# Patient Record
Sex: Female | Born: 1955
Health system: Southern US, Community
[De-identification: ages and names within clinical notes are randomized; demographics above are authoritative.]

## PROBLEM LIST (undated history)

## (undated) DIAGNOSIS — E109 Type 1 diabetes mellitus without complications: Secondary | ICD-10-CM

## (undated) DIAGNOSIS — T7840XA Allergy, unspecified, initial encounter: Secondary | ICD-10-CM

## (undated) DIAGNOSIS — B009 Herpesviral infection, unspecified: Secondary | ICD-10-CM

## (undated) DIAGNOSIS — M8580 Other specified disorders of bone density and structure, unspecified site: Secondary | ICD-10-CM

## (undated) DIAGNOSIS — E559 Vitamin D deficiency, unspecified: Secondary | ICD-10-CM

## (undated) DIAGNOSIS — D649 Anemia, unspecified: Secondary | ICD-10-CM

## (undated) HISTORY — DX: Type 1 diabetes mellitus without complications: E10.9

## (undated) HISTORY — PX: APPENDECTOMY: SHX54

## (undated) HISTORY — DX: Other specified disorders of bone density and structure, unspecified site: M85.80

## (undated) HISTORY — PX: OTHER SURGICAL HISTORY: SHX169

## (undated) HISTORY — PX: MULTIPLE TOOTH EXTRACTIONS: SHX2053

## (undated) HISTORY — DX: Allergy, unspecified, initial encounter: T78.40XA

## (undated) HISTORY — DX: Anemia, unspecified: D64.9

## (undated) HISTORY — DX: Herpesviral infection, unspecified: B00.9

## (undated) HISTORY — DX: Vitamin D deficiency, unspecified: E55.9

---

## 1998-10-07 ENCOUNTER — Other Ambulatory Visit: Admission: RE | Admit: 1998-10-07 | Discharge: 1998-10-07 | Payer: Self-pay | Admitting: Gynecology

## 2000-07-11 ENCOUNTER — Other Ambulatory Visit: Admission: RE | Admit: 2000-07-11 | Discharge: 2000-07-11 | Payer: Self-pay | Admitting: Obstetrics and Gynecology

## 2001-12-21 ENCOUNTER — Encounter: Admission: RE | Admit: 2001-12-21 | Discharge: 2002-03-21 | Payer: Self-pay | Admitting: Internal Medicine

## 2002-04-05 ENCOUNTER — Encounter: Admission: RE | Admit: 2002-04-05 | Discharge: 2002-04-11 | Payer: Self-pay | Admitting: Internal Medicine

## 2002-05-07 ENCOUNTER — Encounter: Admission: RE | Admit: 2002-05-07 | Discharge: 2002-08-05 | Payer: Self-pay | Admitting: Internal Medicine

## 2004-07-09 ENCOUNTER — Encounter: Admission: RE | Admit: 2004-07-09 | Discharge: 2004-07-09 | Payer: Self-pay | Admitting: Obstetrics and Gynecology

## 2004-09-03 ENCOUNTER — Ambulatory Visit: Payer: Self-pay | Admitting: Internal Medicine

## 2004-09-08 ENCOUNTER — Ambulatory Visit: Payer: Self-pay | Admitting: Internal Medicine

## 2004-09-08 HISTORY — PX: COLONOSCOPY: SHX174

## 2005-10-26 ENCOUNTER — Ambulatory Visit (HOSPITAL_COMMUNITY): Admission: RE | Admit: 2005-10-26 | Discharge: 2005-10-26 | Payer: Self-pay | Admitting: Chiropractic Medicine

## 2009-12-03 ENCOUNTER — Encounter: Admission: RE | Admit: 2009-12-03 | Discharge: 2009-12-03 | Payer: Self-pay | Admitting: Internal Medicine

## 2011-05-12 IMAGING — US US ABDOMEN COMPLETE
1 series · 13 of 25 positions shown · non-contrast
Comparison: None

CLINICAL DATA: Right upper quadrant abdominal pain.  History of
appendectomy.

ABDOMINAL ULTRASOUND COMPLETE

[Series 1: us abdomen complete · 0.24mm/px · 13 of 82 slices shown]
[im 1/82]
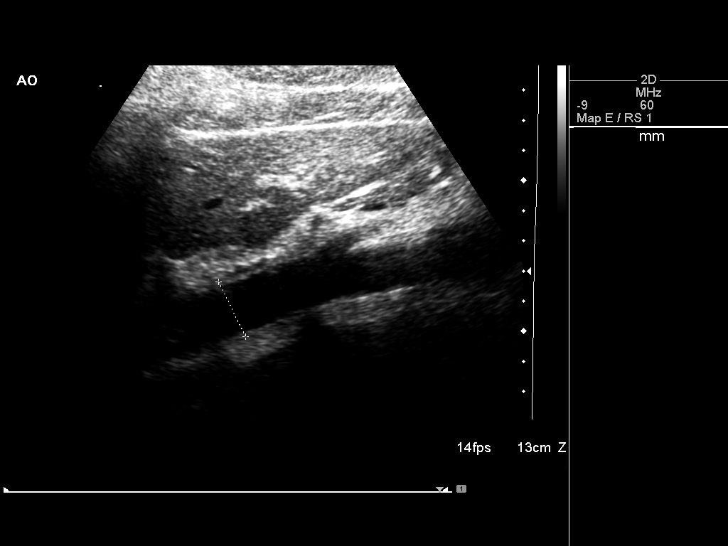
[im 7/82]
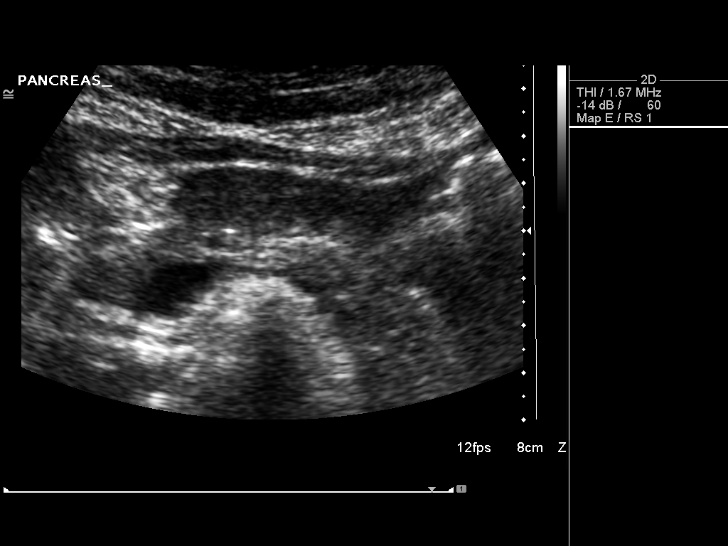
[im 14/82]
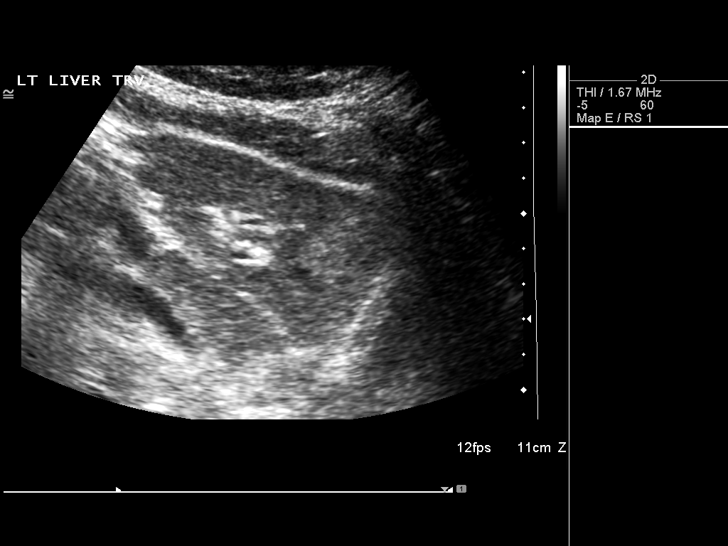
[im 21/82]
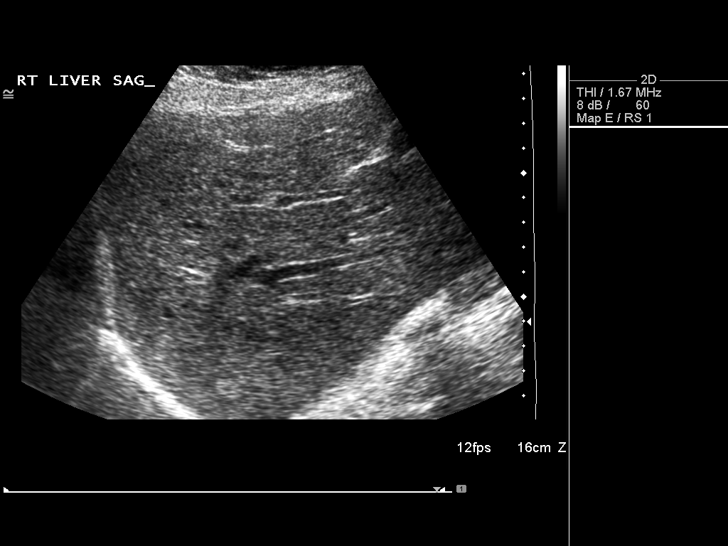
[im 28/82]
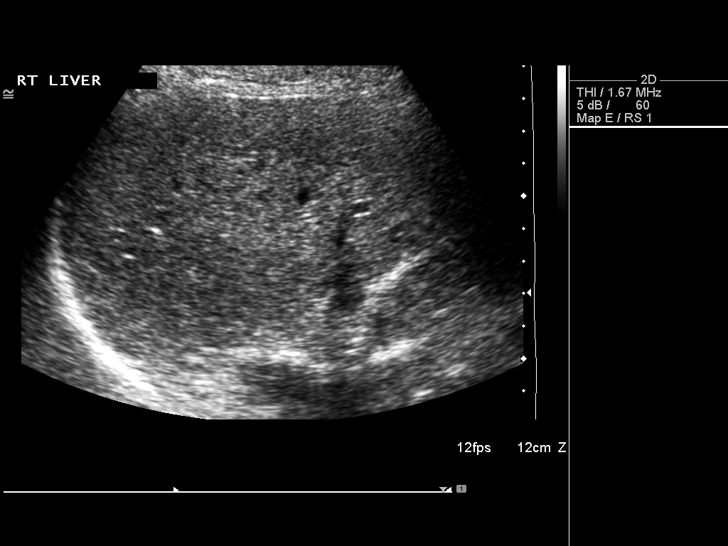
[im 34/82]
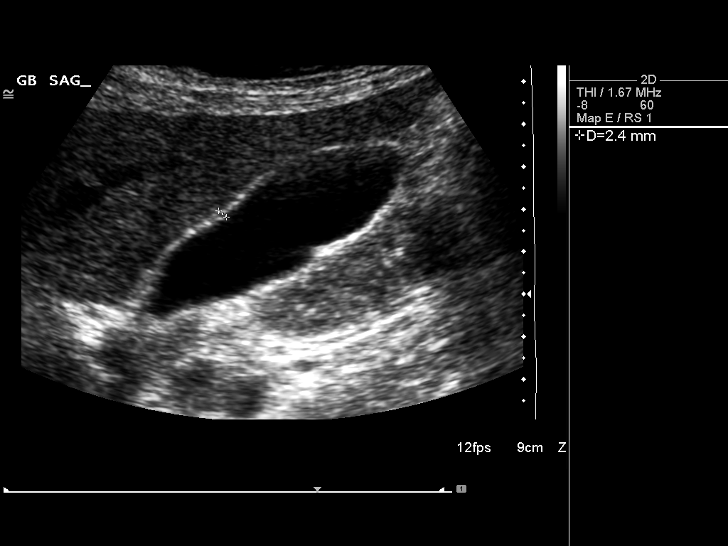
[im 41/82]
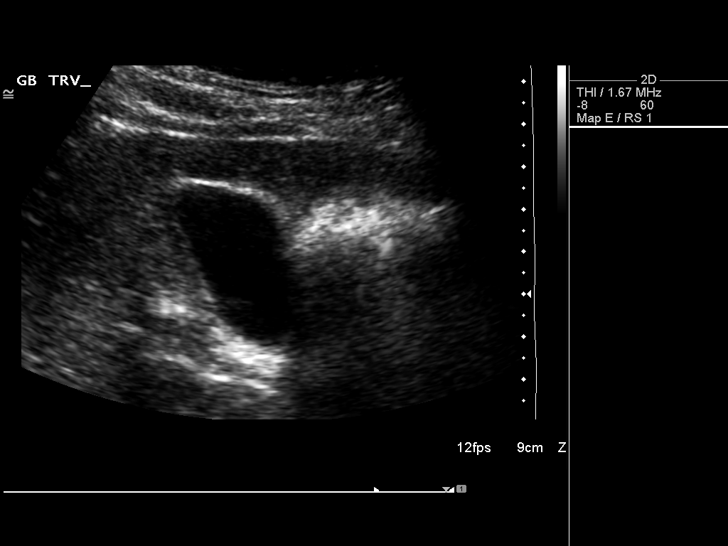
[im 48/82]
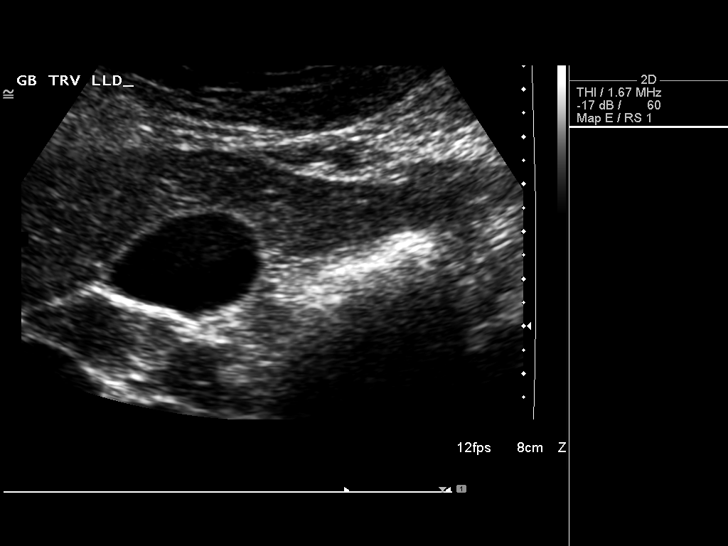
[im 55/82]
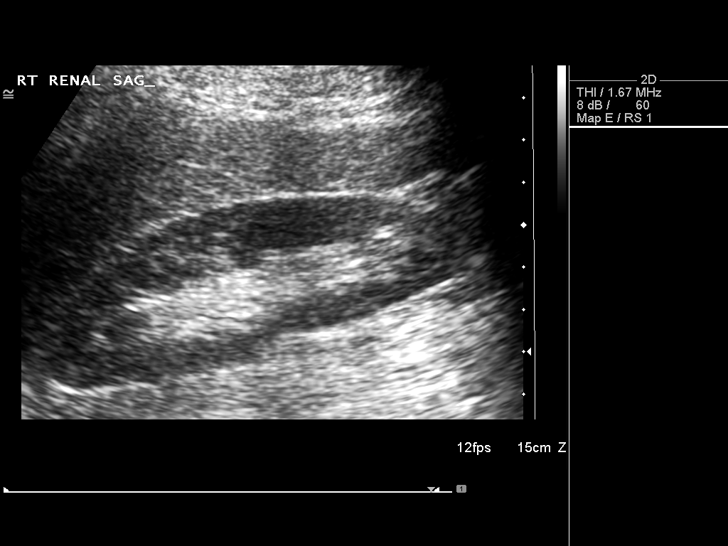
[im 61/82]
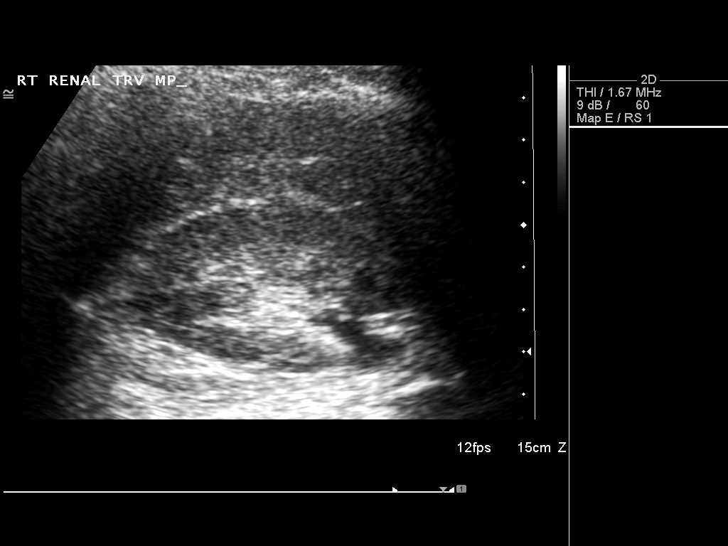
[im 68/82]
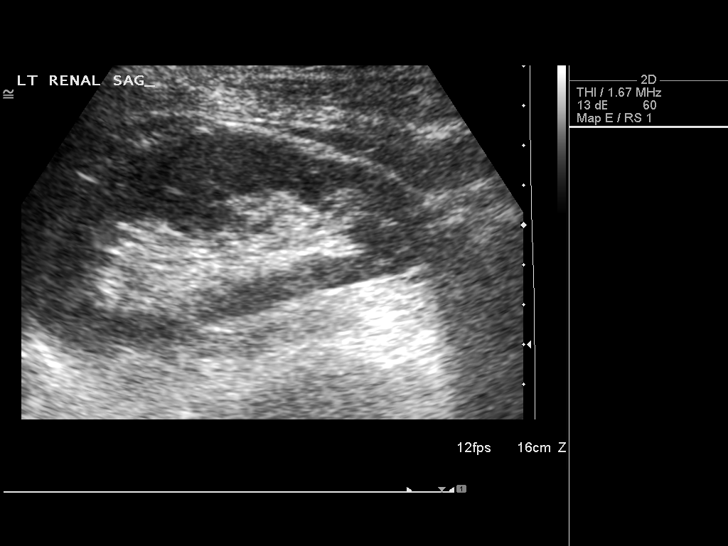
[im 75/82]
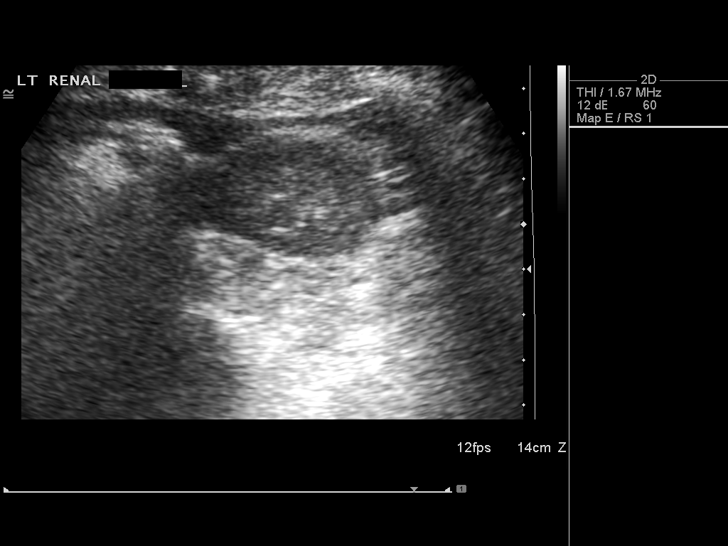
[im 82/82]
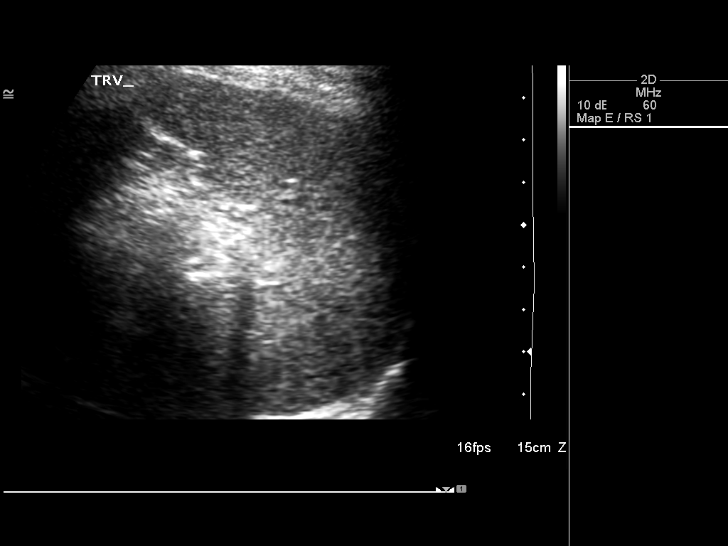

[13 of 25 positions shown; findings below may reference images not displayed]

FINDINGS: Gallbladder: No shadowing gallstones or echogenic sludge. No
gallbladder wall thickening or pericholecystic fluid. The
gallbladder wall thickness measured 2.4 mm. No sonographic Murphy's
sign according to the ultrasound technologist.

CBD: Normal in caliber measuring 3.2 mm. No choledocholithiasis is
evident.

Liver:  Normal size and echotexture without focal parenchymal
abnormality.

IVC:  Patent throughout its visualized course in the abdomen.

Pancreas:  Although the pancreas is difficult to visualize in its
entirety, no focal pancreatic abnormality is identified.

Spleen:  Normal size and echotexture without focal abnormality.
Length is 7.5 cm.

Right kidney:  No hydronephrosis.  Well-preserved cortex.  Normal
parenchymal echotexture without focal abnormalities.  Right renal
length is 10.5 cm.

Left kidney:  No hydronephrosis.  Well-preserved cortex.  Normal
parenchymal echotexture without focal abnormalities.  Left renal
length is 10.2 cm.

Aorta:  Maximum diameter is 2.0 cm.  No aneurysm is evident.

Ascites:  None.
IMPRESSION: No abdominal pathology was demonstrated.

## 2016-01-22 ENCOUNTER — Other Ambulatory Visit: Payer: Self-pay | Admitting: Internal Medicine

## 2016-01-22 DIAGNOSIS — Z1231 Encounter for screening mammogram for malignant neoplasm of breast: Secondary | ICD-10-CM

## 2016-02-23 ENCOUNTER — Ambulatory Visit
Admission: RE | Admit: 2016-02-23 | Discharge: 2016-02-23 | Disposition: A | Discharge status: Home / Self Care | Payer: BC Managed Care – PPO | Source: Ambulatory Visit | Attending: Internal Medicine | Admitting: Internal Medicine

## 2016-02-23 DIAGNOSIS — Z1231 Encounter for screening mammogram for malignant neoplasm of breast: Secondary | ICD-10-CM

## 2017-08-01 ENCOUNTER — Other Ambulatory Visit: Payer: Self-pay | Admitting: Internal Medicine

## 2017-08-01 DIAGNOSIS — Z1231 Encounter for screening mammogram for malignant neoplasm of breast: Secondary | ICD-10-CM

## 2017-08-26 ENCOUNTER — Ambulatory Visit: Payer: BC Managed Care – PPO

## 2018-09-21 ENCOUNTER — Other Ambulatory Visit: Payer: Self-pay | Admitting: Internal Medicine

## 2018-09-21 DIAGNOSIS — Z1231 Encounter for screening mammogram for malignant neoplasm of breast: Secondary | ICD-10-CM

## 2019-10-26 ENCOUNTER — Encounter: Payer: Self-pay | Admitting: Gastroenterology

## 2019-11-01 ENCOUNTER — Other Ambulatory Visit: Payer: Self-pay | Admitting: Internal Medicine

## 2019-11-01 DIAGNOSIS — Z1231 Encounter for screening mammogram for malignant neoplasm of breast: Secondary | ICD-10-CM

## 2019-11-14 ENCOUNTER — Telehealth: Payer: Self-pay | Admitting: *Deleted

## 2019-11-14 ENCOUNTER — Telehealth: Payer: Self-pay | Admitting: General Surgery

## 2019-11-14 NOTE — Telephone Encounter (Signed)
This pt has an insulin pump- she is scheduled for a PV 5-20 - she has a colon 6-3 Thursday with Dr Tarri Glenn   Can you please send a letter to her MD for instructions for her pump   Jodi Knight PV

## 2019-11-14 NOTE — Telephone Encounter (Signed)
Spoke with Lelan Pons regarding patient, abstracted the data that was sent to [place] from Dr Loren Racer office visit 10/26/2019. Sent letter to his office regarding insulin pump

## 2019-11-14 NOTE — Telephone Encounter (Signed)
I am not certain who this patients endocrinologist or pcp is in order to send a note regarding her pump. There is no information in her chart at all. Should she not have had a visit with Dr Tarri Glenn first?

## 2019-11-14 NOTE — Telephone Encounter (Signed)
Sent letter via fax [phone] to Dr Osborne Casco regarding insulin pump instructions for the patients colonoscopy

## 2019-11-14 NOTE — Telephone Encounter (Signed)
No Ma'am, not just for an insulin pump- we received records from Dr Domenick Gong- they are not required to have an OV for diabetes / pump- she is a recall pt- previously seen by Dr Olevia Perches

## 2019-11-15 ENCOUNTER — Ambulatory Visit (AMBULATORY_SURGERY_CENTER): Payer: Self-pay | Admitting: *Deleted

## 2019-11-15 ENCOUNTER — Other Ambulatory Visit: Payer: Self-pay

## 2019-11-15 VITALS — Ht 64.0 in | Wt 179.0 lb

## 2019-11-15 DIAGNOSIS — Z1211 Encounter for screening for malignant neoplasm of colon: Secondary | ICD-10-CM

## 2019-11-15 MED ORDER — SUTAB 1479-225-188 MG PO TABS: 1.0000 | ORAL_TABLET | ORAL | 0 refills | Status: DC

## 2019-11-15 NOTE — Progress Notes (Signed)
Patient denies any allergies to egg or soy products. Patient denies complications with anesthesia/sedation.  Patient denies oxygen use at home and denies diet medications. Emmi instructions for colonoscopy explained and given to patient. Sutab coupon given at [facility] appointment.

## 2019-11-16 NOTE — Telephone Encounter (Signed)
Return fax sent by Dr Osborne Casco regarding insulin pump for her procedure.  Reduce Basal to 50%, boluses as needed. She tests 8x/d. Resume settings post procedure.

## 2019-11-16 NOTE — Telephone Encounter (Signed)
Left a voicemail for the patient to contact our office regarding her insulin pump setting per Dr Osborne Casco

## 2019-11-19 NOTE — Telephone Encounter (Signed)
Patient contacted the office back regarding inusllin pump. Read the instructions per Dr Osborne Casco. The patient verbalized understanding

## 2019-11-22 ENCOUNTER — Ambulatory Visit
Admission: RE | Admit: 2019-11-22 | Discharge: 2019-11-22 | Disposition: A | Discharge status: Home / Self Care | Payer: BC Managed Care – PPO | Source: Ambulatory Visit | Attending: Internal Medicine | Admitting: Internal Medicine

## 2019-11-22 DIAGNOSIS — Z1231 Encounter for screening mammogram for malignant neoplasm of breast: Secondary | ICD-10-CM

## 2019-11-29 ENCOUNTER — Other Ambulatory Visit: Payer: Self-pay

## 2019-11-29 ENCOUNTER — Ambulatory Visit (AMBULATORY_SURGERY_CENTER): Payer: Medicare PPO | Admitting: Gastroenterology

## 2019-11-29 ENCOUNTER — Encounter: Payer: Self-pay | Admitting: Gastroenterology

## 2019-11-29 VITALS — BP 106/51 | HR 50 | Temp 96.8°F | Resp 20 | Ht 64.0 in | Wt 179.0 lb

## 2019-11-29 DIAGNOSIS — D128 Benign neoplasm of rectum: Secondary | ICD-10-CM | POA: Diagnosis not present

## 2019-11-29 DIAGNOSIS — Z1211 Encounter for screening for malignant neoplasm of colon: Secondary | ICD-10-CM

## 2019-11-29 DIAGNOSIS — D122 Benign neoplasm of ascending colon: Secondary | ICD-10-CM

## 2019-11-29 DIAGNOSIS — D127 Benign neoplasm of rectosigmoid junction: Secondary | ICD-10-CM

## 2019-11-29 DIAGNOSIS — D125 Benign neoplasm of sigmoid colon: Secondary | ICD-10-CM

## 2019-11-29 MED ORDER — SODIUM CHLORIDE 0.9 % IV SOLN
500.0000 mL | Freq: Once | INTRAVENOUS | Status: DC
Start: 2019-11-29 — End: 2019-11-29

## 2019-11-29 NOTE — Op Note (Signed)
[facility] Patient Name: Jodi Knight Procedure Date: 11/29/2019 8:51 AM MRN: [id number] Endoscopist: Thornton Park MD, MD Age: 64 Referring MD:  Date of Birth: January 05, 1956 Gender: Female Account #: [id number] Procedure:                Colonoscopy Indications:              Screening for colorectal malignant neoplasm                           Normal screening colonoscopy with Dr. Olevia Perches in 2006                           No known family history of colon cancer or polyps Medicines:                Monitored Anesthesia Care Procedure:                Pre-Anesthesia Assessment:                           - Prior to the procedure, a History and Physical                            was performed, and patient medications and                            allergies were reviewed. The patient's tolerance of                            previous anesthesia was also reviewed. The risks                            and benefits of the procedure and the sedation                            options and risks were discussed with the patient.                            All questions were answered, and informed consent                            was obtained. Prior Anticoagulants: The patient has                            taken no previous anticoagulant or antiplatelet                            agents. ASA Grade Assessment: II - A patient with                            mild systemic disease. After reviewing the risks                            and benefits, the patient was deemed in  satisfactory condition to undergo the procedure.                           After obtaining informed consent, the colonoscope                            was passed under direct vision. Throughout the                            procedure, the patient's blood pressure, pulse, and                            oxygen saturations were monitored continuously. The                            Colonoscope  was introduced through the anus and                            advanced to the 3 cm into the ileum. A second                            forward view of the right colon was performed. The                            colonoscopy was performed without difficulty. The                            patient tolerated the procedure well. The quality                            of the bowel preparation was good. The terminal                            ileum, ileocecal valve, appendiceal orifice, and                            rectum were photographed. Scope In: 9:01:29 AM Scope Out: 9:15:02 AM Scope Withdrawal Time: 0 hours 9 minutes 18 seconds  Total Procedure Duration: 0 hours 13 minutes 33 seconds  Findings:                 The perianal and digital rectal examinations were                            normal.                           Three sessile polyps were found in the rectum,                            sigmoid colon and ascending colon. The polyps were                            1 to 2 mm in size. These polyps were removed with a  cold snare. Resection and retrieval were complete.                            Estimated blood loss was minimal.                           The exam was otherwise without abnormality on                            direct and retroflexion views. Complications:            No immediate complications. Estimated blood loss:                            Minimal. Estimated Blood Loss:     Estimated blood loss was minimal. Impression:               - Three 1 to 2 mm polyps in the rectum, in the                            sigmoid colon and in the ascending colon, removed                            with a cold snare. Resected and retrieved.                           - The examination was otherwise normal on direct                            and retroflexion views. Recommendation:           - Patient has a contact number available for                             emergencies. The signs and symptoms of potential                            delayed complications were discussed with the                            patient. Return to normal activities tomorrow.                            Written discharge instructions were provided to the                            patient.                           - Resume previous diet.                           - Continue present medications.                           - Await pathology results.                           -  Repeat colonoscopy date to be determined after                            pending pathology results are reviewed for                            surveillance.                           - Emerging evidence supports eating a diet of                            fruits, vegetables, grains, calcium, and yogurt                            while reducing red meat and alcohol may reduce the                            risk of colon cancer.                           - Thank you for allowing me to be involved in your                            colon cancer prevention. Thornton Park MD, MD 11/29/2019 9:22:10 AM This report has been signed electronically.

## 2019-11-29 NOTE — Progress Notes (Signed)
Temp JB VS DT  Pt's states no medical or surgical changes since previsit or office visit. 

## 2019-11-29 NOTE — Progress Notes (Signed)
Called to room to assist during endoscopic procedure.  Patient ID and intended procedure confirmed with present staff. Received instructions for my participation in the procedure from the performing physician.  

## 2019-11-29 NOTE — Patient Instructions (Signed)
Discharge instructions given. Handouts on polyps. Resume previous medications. YOU HAD AN ENDOSCOPIC PROCEDURE TODAY AT THE [facility]:   Refer to the procedure report that was given to you for any specific questions about what was found during the examination.  If the procedure report does not answer your questions, please call your gastroenterologist to clarify.  If you requested that your care partner not be given the details of your procedure findings, then the procedure report has been included in a sealed envelope for you to review at your convenience later.  YOU SHOULD EXPECT: Some feelings of bloating in the abdomen. Passage of more gas than usual.  Walking can help get rid of the air that was put into your GI tract during the procedure and reduce the bloating. If you had a lower endoscopy (such as a colonoscopy or flexible sigmoidoscopy) you may notice spotting of blood in your stool or on the toilet paper. If you underwent a bowel prep for your procedure, you may not have a normal bowel movement for a few days.  Please Note:  You might notice some irritation and congestion in your nose or some drainage.  This is from the oxygen used during your procedure.  There is no need for concern and it should clear up in a day or so.  SYMPTOMS TO REPORT IMMEDIATELY:  Following lower endoscopy (colonoscopy or flexible sigmoidoscopy):  Excessive amounts of blood in the stool  Significant tenderness or worsening of abdominal pains  Swelling of the abdomen that is new, acute  Fever of 100F or higher   For urgent or emergent issues, a gastroenterologist can be reached at any hour by calling [phone]. Do not use MyChart messaging for urgent concerns.    DIET:  We do recommend a small meal at first, but then you may proceed to your regular diet.  Drink plenty of fluids but you should avoid alcoholic beverages for 24 hours.  ACTIVITY:  You should plan to take it easy for the rest  of today and you should NOT DRIVE or use heavy machinery until tomorrow (because of the sedation medicines used during the test).    FOLLOW UP: Our staff will call the number listed on your records 48-72 hours following your procedure to check on you and address any questions or concerns that you may have regarding the information given to you following your procedure. If we do not reach you, we will leave a message.  We will attempt to reach you two times.  During this call, we will ask if you have developed any symptoms of COVID 19. If you develop any symptoms (ie: fever, flu-like symptoms, shortness of breath, cough etc.) before then, please call [phone].  If you test positive for Covid 19 in the 2 weeks post procedure, please call and report this information to us.    If any biopsies were taken you will be contacted by phone or by letter within the next 1-3 weeks.  Please call us at [phone] if you have not heard about the biopsies in 3 weeks.    SIGNATURES/CONFIDENTIALITY: You and/or your care partner have signed paperwork which will be entered into your electronic medical record.  These signatures attest to the fact that that the information above on your After Visit Summary has been reviewed and is understood.  Full responsibility of the confidentiality of this discharge information lies with you and/or your care-partner.  

## 2019-11-29 NOTE — Progress Notes (Signed)
Pt Drowsy. VSS. To PACU, report to RN. No anesthetic complications noted.  

## 2019-12-03 ENCOUNTER — Telehealth: Payer: Self-pay | Admitting: *Deleted

## 2019-12-03 NOTE — Telephone Encounter (Signed)
  Follow up Call-  Call back number 11/29/2019  Post procedure Call Back phone  # [phone]  Permission to leave phone message Yes  Some recent data might be hidden     Patient questions:  Do you have a fever, pain , or abdominal swelling? No. Pain Score  0 *  Have you tolerated food without any problems? Yes.    Have you been able to return to your normal activities? Yes.    Do you have any questions about your discharge instructions: Diet   No. Medications  No. Follow up visit  No.  Do you have questions or concerns about your Care? No.  Actions: * If pain score is 4 or above: 1. No action needed, pain <4.Have you developed a fever since your procedure? no  2.   Have you had an respiratory symptoms (SOB or cough) since your procedure? no  3.   Have you tested positive for COVID 19 since your procedure no  4.   Have you had any family members/close contacts diagnosed with the COVID 19 since your procedure?  no   If yes to any of these questions please route to Joylene John, RN and Erenest Rasher, RN

## 2019-12-06 ENCOUNTER — Encounter: Payer: Self-pay | Admitting: Gastroenterology

## 2020-01-21 DIAGNOSIS — E109 Type 1 diabetes mellitus without complications: Secondary | ICD-10-CM | POA: Diagnosis not present

## 2020-02-06 DIAGNOSIS — Z794 Long term (current) use of insulin: Secondary | ICD-10-CM | POA: Diagnosis not present

## 2020-02-06 DIAGNOSIS — M8580 Other specified disorders of bone density and structure, unspecified site: Secondary | ICD-10-CM | POA: Diagnosis not present

## 2020-02-06 DIAGNOSIS — E559 Vitamin D deficiency, unspecified: Secondary | ICD-10-CM | POA: Diagnosis not present

## 2020-02-06 DIAGNOSIS — Z8616 Personal history of COVID-19: Secondary | ICD-10-CM | POA: Diagnosis not present

## 2020-02-06 DIAGNOSIS — E109 Type 1 diabetes mellitus without complications: Secondary | ICD-10-CM | POA: Diagnosis not present

## 2020-02-06 DIAGNOSIS — Z20822 Contact with and (suspected) exposure to covid-19: Secondary | ICD-10-CM | POA: Diagnosis not present

## 2020-02-20 ENCOUNTER — Other Ambulatory Visit: Payer: Self-pay

## 2020-02-20 DIAGNOSIS — Z20822 Contact with and (suspected) exposure to covid-19: Secondary | ICD-10-CM | POA: Diagnosis not present

## 2020-02-21 LAB — NOVEL CORONAVIRUS, NAA: SARS-CoV-2, NAA: NOT DETECTED

## 2020-02-21 LAB — SARS-COV-2, NAA 2 DAY TAT

## 2020-03-04 DIAGNOSIS — E109 Type 1 diabetes mellitus without complications: Secondary | ICD-10-CM | POA: Diagnosis not present

## 2020-03-10 DIAGNOSIS — Z20822 Contact with and (suspected) exposure to covid-19: Secondary | ICD-10-CM | POA: Diagnosis not present

## 2020-04-10 DIAGNOSIS — E109 Type 1 diabetes mellitus without complications: Secondary | ICD-10-CM | POA: Diagnosis not present

## 2020-05-09 DIAGNOSIS — E119 Type 2 diabetes mellitus without complications: Secondary | ICD-10-CM | POA: Diagnosis not present

## 2020-05-09 DIAGNOSIS — H5213 Myopia, bilateral: Secondary | ICD-10-CM | POA: Diagnosis not present

## 2020-05-09 DIAGNOSIS — H2513 Age-related nuclear cataract, bilateral: Secondary | ICD-10-CM | POA: Diagnosis not present

## 2020-05-28 DIAGNOSIS — M25569 Pain in unspecified knee: Secondary | ICD-10-CM | POA: Diagnosis not present

## 2020-05-28 DIAGNOSIS — E109 Type 1 diabetes mellitus without complications: Secondary | ICD-10-CM | POA: Diagnosis not present

## 2020-05-28 DIAGNOSIS — Z8616 Personal history of COVID-19: Secondary | ICD-10-CM | POA: Diagnosis not present

## 2020-05-28 DIAGNOSIS — E669 Obesity, unspecified: Secondary | ICD-10-CM | POA: Diagnosis not present

## 2020-05-28 DIAGNOSIS — E559 Vitamin D deficiency, unspecified: Secondary | ICD-10-CM | POA: Diagnosis not present

## 2020-05-28 DIAGNOSIS — Z794 Long term (current) use of insulin: Secondary | ICD-10-CM | POA: Diagnosis not present

## 2020-05-28 DIAGNOSIS — Z23 Encounter for immunization: Secondary | ICD-10-CM | POA: Diagnosis not present

## 2020-05-28 DIAGNOSIS — M8580 Other specified disorders of bone density and structure, unspecified site: Secondary | ICD-10-CM | POA: Diagnosis not present

## 2020-05-28 DIAGNOSIS — Z6830 Body mass index (BMI) 30.0-30.9, adult: Secondary | ICD-10-CM | POA: Diagnosis not present

## 2020-06-03 DIAGNOSIS — E109 Type 1 diabetes mellitus without complications: Secondary | ICD-10-CM | POA: Diagnosis not present

## 2020-06-23 DIAGNOSIS — Z20822 Contact with and (suspected) exposure to covid-19: Secondary | ICD-10-CM | POA: Diagnosis not present

## 2020-07-10 DIAGNOSIS — M8589 Other specified disorders of bone density and structure, multiple sites: Secondary | ICD-10-CM | POA: Diagnosis not present

## 2020-07-10 DIAGNOSIS — E559 Vitamin D deficiency, unspecified: Secondary | ICD-10-CM | POA: Diagnosis not present

## 2020-07-29 DIAGNOSIS — Z4681 Encounter for fitting and adjustment of insulin pump: Secondary | ICD-10-CM | POA: Diagnosis not present

## 2020-07-29 DIAGNOSIS — E109 Type 1 diabetes mellitus without complications: Secondary | ICD-10-CM | POA: Diagnosis not present

## 2020-07-29 DIAGNOSIS — Z794 Long term (current) use of insulin: Secondary | ICD-10-CM | POA: Diagnosis not present

## 2020-08-21 DIAGNOSIS — E109 Type 1 diabetes mellitus without complications: Secondary | ICD-10-CM | POA: Diagnosis not present

## 2020-09-08 DIAGNOSIS — E109 Type 1 diabetes mellitus without complications: Secondary | ICD-10-CM | POA: Diagnosis not present

## 2020-09-08 DIAGNOSIS — Z794 Long term (current) use of insulin: Secondary | ICD-10-CM | POA: Diagnosis not present

## 2020-09-08 DIAGNOSIS — Z4681 Encounter for fitting and adjustment of insulin pump: Secondary | ICD-10-CM | POA: Diagnosis not present

## 2020-09-18 DIAGNOSIS — E109 Type 1 diabetes mellitus without complications: Secondary | ICD-10-CM | POA: Diagnosis not present

## 2020-10-08 DIAGNOSIS — E669 Obesity, unspecified: Secondary | ICD-10-CM | POA: Diagnosis not present

## 2020-10-08 DIAGNOSIS — E109 Type 1 diabetes mellitus without complications: Secondary | ICD-10-CM | POA: Diagnosis not present

## 2020-10-08 DIAGNOSIS — M8580 Other specified disorders of bone density and structure, unspecified site: Secondary | ICD-10-CM | POA: Diagnosis not present

## 2020-10-08 DIAGNOSIS — M25569 Pain in unspecified knee: Secondary | ICD-10-CM | POA: Diagnosis not present

## 2020-10-08 DIAGNOSIS — E559 Vitamin D deficiency, unspecified: Secondary | ICD-10-CM | POA: Diagnosis not present

## 2020-10-08 DIAGNOSIS — Z6830 Body mass index (BMI) 30.0-30.9, adult: Secondary | ICD-10-CM | POA: Diagnosis not present

## 2020-10-08 DIAGNOSIS — Z794 Long term (current) use of insulin: Secondary | ICD-10-CM | POA: Diagnosis not present

## 2020-10-19 DIAGNOSIS — E109 Type 1 diabetes mellitus without complications: Secondary | ICD-10-CM | POA: Diagnosis not present

## 2020-11-13 DIAGNOSIS — E109 Type 1 diabetes mellitus without complications: Secondary | ICD-10-CM | POA: Diagnosis not present

## 2020-11-18 DIAGNOSIS — E109 Type 1 diabetes mellitus without complications: Secondary | ICD-10-CM | POA: Diagnosis not present

## 2020-11-26 DIAGNOSIS — E109 Type 1 diabetes mellitus without complications: Secondary | ICD-10-CM | POA: Diagnosis not present

## 2020-11-26 DIAGNOSIS — Z4681 Encounter for fitting and adjustment of insulin pump: Secondary | ICD-10-CM | POA: Diagnosis not present

## 2020-11-26 DIAGNOSIS — Z794 Long term (current) use of insulin: Secondary | ICD-10-CM | POA: Diagnosis not present

## 2020-12-19 DIAGNOSIS — E109 Type 1 diabetes mellitus without complications: Secondary | ICD-10-CM | POA: Diagnosis not present

## 2021-01-18 DIAGNOSIS — E109 Type 1 diabetes mellitus without complications: Secondary | ICD-10-CM | POA: Diagnosis not present

## 2021-02-02 DIAGNOSIS — E109 Type 1 diabetes mellitus without complications: Secondary | ICD-10-CM | POA: Diagnosis not present

## 2021-02-18 DIAGNOSIS — E109 Type 1 diabetes mellitus without complications: Secondary | ICD-10-CM | POA: Diagnosis not present

## 2021-03-21 DIAGNOSIS — E109 Type 1 diabetes mellitus without complications: Secondary | ICD-10-CM | POA: Diagnosis not present

## 2021-04-02 DIAGNOSIS — Z4681 Encounter for fitting and adjustment of insulin pump: Secondary | ICD-10-CM | POA: Diagnosis not present

## 2021-04-02 DIAGNOSIS — Z23 Encounter for immunization: Secondary | ICD-10-CM | POA: Diagnosis not present

## 2021-04-02 DIAGNOSIS — E109 Type 1 diabetes mellitus without complications: Secondary | ICD-10-CM | POA: Diagnosis not present

## 2021-04-02 DIAGNOSIS — Z794 Long term (current) use of insulin: Secondary | ICD-10-CM | POA: Diagnosis not present

## 2021-04-20 DIAGNOSIS — E109 Type 1 diabetes mellitus without complications: Secondary | ICD-10-CM | POA: Diagnosis not present

## 2021-04-23 DIAGNOSIS — E109 Type 1 diabetes mellitus without complications: Secondary | ICD-10-CM | POA: Diagnosis not present

## 2021-05-12 DIAGNOSIS — H2513 Age-related nuclear cataract, bilateral: Secondary | ICD-10-CM | POA: Diagnosis not present

## 2021-05-12 DIAGNOSIS — H5213 Myopia, bilateral: Secondary | ICD-10-CM | POA: Diagnosis not present

## 2021-05-12 DIAGNOSIS — E113293 Type 2 diabetes mellitus with mild nonproliferative diabetic retinopathy without macular edema, bilateral: Secondary | ICD-10-CM | POA: Diagnosis not present

## 2021-05-27 DIAGNOSIS — Z794 Long term (current) use of insulin: Secondary | ICD-10-CM | POA: Diagnosis not present

## 2021-05-27 DIAGNOSIS — Z4681 Encounter for fitting and adjustment of insulin pump: Secondary | ICD-10-CM | POA: Diagnosis not present

## 2021-05-27 DIAGNOSIS — M8580 Other specified disorders of bone density and structure, unspecified site: Secondary | ICD-10-CM | POA: Diagnosis not present

## 2021-05-27 DIAGNOSIS — Z8616 Personal history of COVID-19: Secondary | ICD-10-CM | POA: Diagnosis not present

## 2021-05-27 DIAGNOSIS — E669 Obesity, unspecified: Secondary | ICD-10-CM | POA: Diagnosis not present

## 2021-05-27 DIAGNOSIS — E559 Vitamin D deficiency, unspecified: Secondary | ICD-10-CM | POA: Diagnosis not present

## 2021-05-27 DIAGNOSIS — E109 Type 1 diabetes mellitus without complications: Secondary | ICD-10-CM | POA: Diagnosis not present

## 2021-07-02 DIAGNOSIS — E109 Type 1 diabetes mellitus without complications: Secondary | ICD-10-CM | POA: Diagnosis not present

## 2021-07-02 DIAGNOSIS — Z4681 Encounter for fitting and adjustment of insulin pump: Secondary | ICD-10-CM | POA: Diagnosis not present

## 2021-07-02 DIAGNOSIS — Z794 Long term (current) use of insulin: Secondary | ICD-10-CM | POA: Diagnosis not present

## 2021-07-13 DIAGNOSIS — E109 Type 1 diabetes mellitus without complications: Secondary | ICD-10-CM | POA: Diagnosis not present

## 2021-09-30 DIAGNOSIS — E109 Type 1 diabetes mellitus without complications: Secondary | ICD-10-CM | POA: Diagnosis not present

## 2021-09-30 DIAGNOSIS — Z794 Long term (current) use of insulin: Secondary | ICD-10-CM | POA: Diagnosis not present

## 2021-09-30 DIAGNOSIS — Z4681 Encounter for fitting and adjustment of insulin pump: Secondary | ICD-10-CM | POA: Diagnosis not present

## 2021-10-05 DIAGNOSIS — E109 Type 1 diabetes mellitus without complications: Secondary | ICD-10-CM | POA: Diagnosis not present

## 2021-10-10 DIAGNOSIS — E109 Type 1 diabetes mellitus without complications: Secondary | ICD-10-CM | POA: Diagnosis not present

## 2021-11-10 DIAGNOSIS — H903 Sensorineural hearing loss, bilateral: Secondary | ICD-10-CM | POA: Diagnosis not present

## 2021-11-16 DIAGNOSIS — E109 Type 1 diabetes mellitus without complications: Secondary | ICD-10-CM | POA: Diagnosis not present

## 2021-11-16 DIAGNOSIS — E559 Vitamin D deficiency, unspecified: Secondary | ICD-10-CM | POA: Diagnosis not present

## 2021-11-16 DIAGNOSIS — M8580 Other specified disorders of bone density and structure, unspecified site: Secondary | ICD-10-CM | POA: Diagnosis not present

## 2021-11-16 DIAGNOSIS — Z1339 Encounter for screening examination for other mental health and behavioral disorders: Secondary | ICD-10-CM | POA: Diagnosis not present

## 2021-11-16 DIAGNOSIS — E669 Obesity, unspecified: Secondary | ICD-10-CM | POA: Diagnosis not present

## 2021-11-16 DIAGNOSIS — Z794 Long term (current) use of insulin: Secondary | ICD-10-CM | POA: Diagnosis not present

## 2021-11-16 DIAGNOSIS — Z1331 Encounter for screening for depression: Secondary | ICD-10-CM | POA: Diagnosis not present

## 2021-11-16 DIAGNOSIS — Z4681 Encounter for fitting and adjustment of insulin pump: Secondary | ICD-10-CM | POA: Diagnosis not present

## 2021-11-16 DIAGNOSIS — Z8616 Personal history of COVID-19: Secondary | ICD-10-CM | POA: Diagnosis not present

## 2022-01-01 DIAGNOSIS — E109 Type 1 diabetes mellitus without complications: Secondary | ICD-10-CM | POA: Diagnosis not present

## 2022-01-18 DIAGNOSIS — E109 Type 1 diabetes mellitus without complications: Secondary | ICD-10-CM | POA: Diagnosis not present

## 2022-02-09 DIAGNOSIS — Z794 Long term (current) use of insulin: Secondary | ICD-10-CM | POA: Diagnosis not present

## 2022-02-09 DIAGNOSIS — Z4681 Encounter for fitting and adjustment of insulin pump: Secondary | ICD-10-CM | POA: Diagnosis not present

## 2022-02-09 DIAGNOSIS — E109 Type 1 diabetes mellitus without complications: Secondary | ICD-10-CM | POA: Diagnosis not present

## 2022-03-22 DIAGNOSIS — E109 Type 1 diabetes mellitus without complications: Secondary | ICD-10-CM | POA: Diagnosis not present

## 2022-04-19 DIAGNOSIS — E109 Type 1 diabetes mellitus without complications: Secondary | ICD-10-CM | POA: Diagnosis not present

## 2022-05-10 DIAGNOSIS — H0261 Xanthelasma of right upper eyelid: Secondary | ICD-10-CM | POA: Diagnosis not present

## 2022-05-10 DIAGNOSIS — L820 Inflamed seborrheic keratosis: Secondary | ICD-10-CM | POA: Diagnosis not present

## 2022-05-10 DIAGNOSIS — L821 Other seborrheic keratosis: Secondary | ICD-10-CM | POA: Diagnosis not present

## 2022-05-10 DIAGNOSIS — H0264 Xanthelasma of left upper eyelid: Secondary | ICD-10-CM | POA: Diagnosis not present

## 2022-05-10 DIAGNOSIS — H0262 Xanthelasma of right lower eyelid: Secondary | ICD-10-CM | POA: Diagnosis not present

## 2022-05-10 DIAGNOSIS — H0265 Xanthelasma of left lower eyelid: Secondary | ICD-10-CM | POA: Diagnosis not present

## 2022-05-18 DIAGNOSIS — E113293 Type 2 diabetes mellitus with mild nonproliferative diabetic retinopathy without macular edema, bilateral: Secondary | ICD-10-CM | POA: Diagnosis not present

## 2022-05-18 DIAGNOSIS — H5213 Myopia, bilateral: Secondary | ICD-10-CM | POA: Diagnosis not present

## 2022-05-21 DIAGNOSIS — E109 Type 1 diabetes mellitus without complications: Secondary | ICD-10-CM | POA: Diagnosis not present

## 2022-05-21 DIAGNOSIS — E559 Vitamin D deficiency, unspecified: Secondary | ICD-10-CM | POA: Diagnosis not present

## 2022-05-21 DIAGNOSIS — Z1212 Encounter for screening for malignant neoplasm of rectum: Secondary | ICD-10-CM | POA: Diagnosis not present

## 2022-05-28 DIAGNOSIS — Z23 Encounter for immunization: Secondary | ICD-10-CM | POA: Diagnosis not present

## 2022-05-28 DIAGNOSIS — E103299 Type 1 diabetes mellitus with mild nonproliferative diabetic retinopathy without macular edema, unspecified eye: Secondary | ICD-10-CM | POA: Diagnosis not present

## 2022-05-28 DIAGNOSIS — Z4681 Encounter for fitting and adjustment of insulin pump: Secondary | ICD-10-CM | POA: Diagnosis not present

## 2022-05-28 DIAGNOSIS — R82998 Other abnormal findings in urine: Secondary | ICD-10-CM | POA: Diagnosis not present

## 2022-05-28 DIAGNOSIS — E559 Vitamin D deficiency, unspecified: Secondary | ICD-10-CM | POA: Diagnosis not present

## 2022-05-28 DIAGNOSIS — Z0000 Encounter for general adult medical examination without abnormal findings: Secondary | ICD-10-CM | POA: Diagnosis not present

## 2022-05-28 DIAGNOSIS — Z1331 Encounter for screening for depression: Secondary | ICD-10-CM | POA: Diagnosis not present

## 2022-05-28 DIAGNOSIS — Z794 Long term (current) use of insulin: Secondary | ICD-10-CM | POA: Diagnosis not present

## 2022-05-28 DIAGNOSIS — M8580 Other specified disorders of bone density and structure, unspecified site: Secondary | ICD-10-CM | POA: Diagnosis not present

## 2022-05-28 DIAGNOSIS — E669 Obesity, unspecified: Secondary | ICD-10-CM | POA: Diagnosis not present

## 2022-06-01 ENCOUNTER — Other Ambulatory Visit: Payer: Self-pay | Admitting: Internal Medicine

## 2022-06-01 DIAGNOSIS — Z1231 Encounter for screening mammogram for malignant neoplasm of breast: Secondary | ICD-10-CM

## 2022-06-10 DIAGNOSIS — E109 Type 1 diabetes mellitus without complications: Secondary | ICD-10-CM | POA: Diagnosis not present

## 2022-07-08 DIAGNOSIS — E109 Type 1 diabetes mellitus without complications: Secondary | ICD-10-CM | POA: Diagnosis not present

## 2022-07-29 ENCOUNTER — Ambulatory Visit
Admission: RE | Admit: 2022-07-29 | Discharge: 2022-07-29 | Disposition: A | Discharge status: Home / Self Care | Payer: Medicare PPO | Source: Ambulatory Visit | Attending: Internal Medicine | Admitting: Internal Medicine

## 2022-07-29 DIAGNOSIS — Z1231 Encounter for screening mammogram for malignant neoplasm of breast: Secondary | ICD-10-CM | POA: Diagnosis not present

## 2022-08-03 DIAGNOSIS — L089 Local infection of the skin and subcutaneous tissue, unspecified: Secondary | ICD-10-CM | POA: Diagnosis not present

## 2022-08-03 DIAGNOSIS — Z01419 Encounter for gynecological examination (general) (routine) without abnormal findings: Secondary | ICD-10-CM | POA: Diagnosis not present

## 2022-08-03 DIAGNOSIS — Z124 Encounter for screening for malignant neoplasm of cervix: Secondary | ICD-10-CM | POA: Diagnosis not present

## 2022-08-03 DIAGNOSIS — Z1151 Encounter for screening for human papillomavirus (HPV): Secondary | ICD-10-CM | POA: Diagnosis not present

## 2022-08-26 DIAGNOSIS — Z03818 Encounter for observation for suspected exposure to other biological agents ruled out: Secondary | ICD-10-CM | POA: Diagnosis not present

## 2022-08-26 DIAGNOSIS — J029 Acute pharyngitis, unspecified: Secondary | ICD-10-CM | POA: Diagnosis not present

## 2022-08-26 DIAGNOSIS — R0981 Nasal congestion: Secondary | ICD-10-CM | POA: Diagnosis not present

## 2022-08-26 DIAGNOSIS — R051 Acute cough: Secondary | ICD-10-CM | POA: Diagnosis not present

## 2022-08-26 DIAGNOSIS — J069 Acute upper respiratory infection, unspecified: Secondary | ICD-10-CM | POA: Diagnosis not present

## 2022-08-26 DIAGNOSIS — J3489 Other specified disorders of nose and nasal sinuses: Secondary | ICD-10-CM | POA: Diagnosis not present

## 2022-08-31 DIAGNOSIS — Z794 Long term (current) use of insulin: Secondary | ICD-10-CM | POA: Diagnosis not present

## 2022-08-31 DIAGNOSIS — E103299 Type 1 diabetes mellitus with mild nonproliferative diabetic retinopathy without macular edema, unspecified eye: Secondary | ICD-10-CM | POA: Diagnosis not present

## 2022-08-31 DIAGNOSIS — Z4681 Encounter for fitting and adjustment of insulin pump: Secondary | ICD-10-CM | POA: Diagnosis not present

## 2022-09-21 DIAGNOSIS — N611 Abscess of the breast and nipple: Secondary | ICD-10-CM | POA: Diagnosis not present

## 2022-09-30 DIAGNOSIS — E109 Type 1 diabetes mellitus without complications: Secondary | ICD-10-CM | POA: Diagnosis not present

## 2022-11-04 DIAGNOSIS — E109 Type 1 diabetes mellitus without complications: Secondary | ICD-10-CM | POA: Diagnosis not present

## 2022-11-15 DIAGNOSIS — E109 Type 1 diabetes mellitus without complications: Secondary | ICD-10-CM | POA: Diagnosis not present

## 2022-12-14 DIAGNOSIS — M8580 Other specified disorders of bone density and structure, unspecified site: Secondary | ICD-10-CM | POA: Diagnosis not present

## 2022-12-14 DIAGNOSIS — E103299 Type 1 diabetes mellitus with mild nonproliferative diabetic retinopathy without macular edema, unspecified eye: Secondary | ICD-10-CM | POA: Diagnosis not present

## 2022-12-14 DIAGNOSIS — E559 Vitamin D deficiency, unspecified: Secondary | ICD-10-CM | POA: Diagnosis not present

## 2022-12-14 DIAGNOSIS — E7800 Pure hypercholesterolemia, unspecified: Secondary | ICD-10-CM | POA: Diagnosis not present

## 2022-12-14 DIAGNOSIS — E669 Obesity, unspecified: Secondary | ICD-10-CM | POA: Diagnosis not present

## 2022-12-14 DIAGNOSIS — Z794 Long term (current) use of insulin: Secondary | ICD-10-CM | POA: Diagnosis not present

## 2022-12-14 DIAGNOSIS — Z4681 Encounter for fitting and adjustment of insulin pump: Secondary | ICD-10-CM | POA: Diagnosis not present

## 2023-01-03 DIAGNOSIS — E109 Type 1 diabetes mellitus without complications: Secondary | ICD-10-CM | POA: Diagnosis not present

## 2023-02-13 DIAGNOSIS — E109 Type 1 diabetes mellitus without complications: Secondary | ICD-10-CM | POA: Diagnosis not present

## 2023-03-01 DIAGNOSIS — E7800 Pure hypercholesterolemia, unspecified: Secondary | ICD-10-CM | POA: Diagnosis not present

## 2023-03-01 DIAGNOSIS — E103299 Type 1 diabetes mellitus with mild nonproliferative diabetic retinopathy without macular edema, unspecified eye: Secondary | ICD-10-CM | POA: Diagnosis not present

## 2023-03-01 DIAGNOSIS — Z23 Encounter for immunization: Secondary | ICD-10-CM | POA: Diagnosis not present

## 2023-03-01 DIAGNOSIS — Z4681 Encounter for fitting and adjustment of insulin pump: Secondary | ICD-10-CM | POA: Diagnosis not present

## 2023-03-01 DIAGNOSIS — Z794 Long term (current) use of insulin: Secondary | ICD-10-CM | POA: Diagnosis not present

## 2023-03-25 DIAGNOSIS — E109 Type 1 diabetes mellitus without complications: Secondary | ICD-10-CM | POA: Diagnosis not present

## 2023-05-14 DIAGNOSIS — E109 Type 1 diabetes mellitus without complications: Secondary | ICD-10-CM | POA: Diagnosis not present

## 2023-05-31 DIAGNOSIS — E119 Type 2 diabetes mellitus without complications: Secondary | ICD-10-CM | POA: Diagnosis not present

## 2023-05-31 DIAGNOSIS — H524 Presbyopia: Secondary | ICD-10-CM | POA: Diagnosis not present

## 2023-06-01 DIAGNOSIS — E103299 Type 1 diabetes mellitus with mild nonproliferative diabetic retinopathy without macular edema, unspecified eye: Secondary | ICD-10-CM | POA: Diagnosis not present

## 2023-06-01 DIAGNOSIS — E559 Vitamin D deficiency, unspecified: Secondary | ICD-10-CM | POA: Diagnosis not present

## 2023-06-01 DIAGNOSIS — R7989 Other specified abnormal findings of blood chemistry: Secondary | ICD-10-CM | POA: Diagnosis not present

## 2023-06-01 DIAGNOSIS — E7800 Pure hypercholesterolemia, unspecified: Secondary | ICD-10-CM | POA: Diagnosis not present

## 2023-06-06 DIAGNOSIS — E109 Type 1 diabetes mellitus without complications: Secondary | ICD-10-CM | POA: Diagnosis not present

## 2023-06-06 DIAGNOSIS — Z1212 Encounter for screening for malignant neoplasm of rectum: Secondary | ICD-10-CM | POA: Diagnosis not present

## 2023-06-06 DIAGNOSIS — R82998 Other abnormal findings in urine: Secondary | ICD-10-CM | POA: Diagnosis not present

## 2023-06-08 DIAGNOSIS — Z6830 Body mass index (BMI) 30.0-30.9, adult: Secondary | ICD-10-CM | POA: Diagnosis not present

## 2023-06-08 DIAGNOSIS — M8580 Other specified disorders of bone density and structure, unspecified site: Secondary | ICD-10-CM | POA: Diagnosis not present

## 2023-06-08 DIAGNOSIS — Z4681 Encounter for fitting and adjustment of insulin pump: Secondary | ICD-10-CM | POA: Diagnosis not present

## 2023-06-08 DIAGNOSIS — E669 Obesity, unspecified: Secondary | ICD-10-CM | POA: Diagnosis not present

## 2023-06-08 DIAGNOSIS — E559 Vitamin D deficiency, unspecified: Secondary | ICD-10-CM | POA: Diagnosis not present

## 2023-06-08 DIAGNOSIS — Z0000 Encounter for general adult medical examination without abnormal findings: Secondary | ICD-10-CM | POA: Diagnosis not present

## 2023-06-08 DIAGNOSIS — E7800 Pure hypercholesterolemia, unspecified: Secondary | ICD-10-CM | POA: Diagnosis not present

## 2023-06-08 DIAGNOSIS — Z1339 Encounter for screening examination for other mental health and behavioral disorders: Secondary | ICD-10-CM | POA: Diagnosis not present

## 2023-06-08 DIAGNOSIS — E103299 Type 1 diabetes mellitus with mild nonproliferative diabetic retinopathy without macular edema, unspecified eye: Secondary | ICD-10-CM | POA: Diagnosis not present

## 2023-06-08 DIAGNOSIS — Z1331 Encounter for screening for depression: Secondary | ICD-10-CM | POA: Diagnosis not present

## 2023-06-13 DIAGNOSIS — E109 Type 1 diabetes mellitus without complications: Secondary | ICD-10-CM | POA: Diagnosis not present

## 2023-08-12 DIAGNOSIS — E109 Type 1 diabetes mellitus without complications: Secondary | ICD-10-CM | POA: Diagnosis not present

## 2023-08-16 DIAGNOSIS — L821 Other seborrheic keratosis: Secondary | ICD-10-CM | POA: Diagnosis not present

## 2023-08-16 DIAGNOSIS — D2371 Other benign neoplasm of skin of right lower limb, including hip: Secondary | ICD-10-CM | POA: Diagnosis not present

## 2023-08-16 DIAGNOSIS — H0264 Xanthelasma of left upper eyelid: Secondary | ICD-10-CM | POA: Diagnosis not present

## 2023-08-16 DIAGNOSIS — L218 Other seborrheic dermatitis: Secondary | ICD-10-CM | POA: Diagnosis not present

## 2023-08-16 DIAGNOSIS — D485 Neoplasm of uncertain behavior of skin: Secondary | ICD-10-CM | POA: Diagnosis not present

## 2023-08-16 DIAGNOSIS — B078 Other viral warts: Secondary | ICD-10-CM | POA: Diagnosis not present

## 2023-08-16 DIAGNOSIS — H0262 Xanthelasma of right lower eyelid: Secondary | ICD-10-CM | POA: Diagnosis not present

## 2023-08-16 DIAGNOSIS — L820 Inflamed seborrheic keratosis: Secondary | ICD-10-CM | POA: Diagnosis not present

## 2023-08-16 DIAGNOSIS — H0261 Xanthelasma of right upper eyelid: Secondary | ICD-10-CM | POA: Diagnosis not present

## 2023-08-16 DIAGNOSIS — H0265 Xanthelasma of left lower eyelid: Secondary | ICD-10-CM | POA: Diagnosis not present

## 2023-09-01 DIAGNOSIS — E109 Type 1 diabetes mellitus without complications: Secondary | ICD-10-CM | POA: Diagnosis not present

## 2023-09-27 DIAGNOSIS — E7800 Pure hypercholesterolemia, unspecified: Secondary | ICD-10-CM | POA: Diagnosis not present

## 2023-09-27 DIAGNOSIS — E103299 Type 1 diabetes mellitus with mild nonproliferative diabetic retinopathy without macular edema, unspecified eye: Secondary | ICD-10-CM | POA: Diagnosis not present

## 2023-09-27 DIAGNOSIS — E109 Type 1 diabetes mellitus without complications: Secondary | ICD-10-CM | POA: Diagnosis not present

## 2023-09-27 DIAGNOSIS — Z794 Long term (current) use of insulin: Secondary | ICD-10-CM | POA: Diagnosis not present

## 2023-09-27 DIAGNOSIS — Z4681 Encounter for fitting and adjustment of insulin pump: Secondary | ICD-10-CM | POA: Diagnosis not present

## 2023-11-10 DIAGNOSIS — E109 Type 1 diabetes mellitus without complications: Secondary | ICD-10-CM | POA: Diagnosis not present

## 2023-12-07 DIAGNOSIS — E109 Type 1 diabetes mellitus without complications: Secondary | ICD-10-CM | POA: Diagnosis not present

## 2023-12-12 DIAGNOSIS — E559 Vitamin D deficiency, unspecified: Secondary | ICD-10-CM | POA: Diagnosis not present

## 2023-12-12 DIAGNOSIS — M8580 Other specified disorders of bone density and structure, unspecified site: Secondary | ICD-10-CM | POA: Diagnosis not present

## 2023-12-12 DIAGNOSIS — Z4681 Encounter for fitting and adjustment of insulin pump: Secondary | ICD-10-CM | POA: Diagnosis not present

## 2023-12-12 DIAGNOSIS — E669 Obesity, unspecified: Secondary | ICD-10-CM | POA: Diagnosis not present

## 2023-12-12 DIAGNOSIS — E7800 Pure hypercholesterolemia, unspecified: Secondary | ICD-10-CM | POA: Diagnosis not present

## 2023-12-12 DIAGNOSIS — E103299 Type 1 diabetes mellitus with mild nonproliferative diabetic retinopathy without macular edema, unspecified eye: Secondary | ICD-10-CM | POA: Diagnosis not present

## 2023-12-12 DIAGNOSIS — M549 Dorsalgia, unspecified: Secondary | ICD-10-CM | POA: Diagnosis not present

## 2024-01-04 DIAGNOSIS — M545 Low back pain, unspecified: Secondary | ICD-10-CM | POA: Diagnosis not present

## 2024-01-04 DIAGNOSIS — M533 Sacrococcygeal disorders, not elsewhere classified: Secondary | ICD-10-CM | POA: Diagnosis not present

## 2024-01-24 DIAGNOSIS — M533 Sacrococcygeal disorders, not elsewhere classified: Secondary | ICD-10-CM | POA: Diagnosis not present

## 2024-01-24 DIAGNOSIS — M545 Low back pain, unspecified: Secondary | ICD-10-CM | POA: Diagnosis not present

## 2024-02-01 DIAGNOSIS — M545 Low back pain, unspecified: Secondary | ICD-10-CM | POA: Diagnosis not present

## 2024-02-06 DIAGNOSIS — M545 Low back pain, unspecified: Secondary | ICD-10-CM | POA: Diagnosis not present

## 2024-02-08 DIAGNOSIS — E109 Type 1 diabetes mellitus without complications: Secondary | ICD-10-CM | POA: Diagnosis not present

## 2024-02-17 DIAGNOSIS — M545 Low back pain, unspecified: Secondary | ICD-10-CM | POA: Diagnosis not present

## 2024-02-20 DIAGNOSIS — M545 Low back pain, unspecified: Secondary | ICD-10-CM | POA: Diagnosis not present

## 2024-02-24 DIAGNOSIS — M545 Low back pain, unspecified: Secondary | ICD-10-CM | POA: Diagnosis not present

## 2024-02-28 DIAGNOSIS — M545 Low back pain, unspecified: Secondary | ICD-10-CM | POA: Diagnosis not present

## 2024-03-06 DIAGNOSIS — E109 Type 1 diabetes mellitus without complications: Secondary | ICD-10-CM | POA: Diagnosis not present

## 2024-03-07 DIAGNOSIS — M545 Low back pain, unspecified: Secondary | ICD-10-CM | POA: Diagnosis not present

## 2024-03-20 DIAGNOSIS — M545 Low back pain, unspecified: Secondary | ICD-10-CM | POA: Diagnosis not present

## 2024-03-28 DIAGNOSIS — M545 Low back pain, unspecified: Secondary | ICD-10-CM | POA: Diagnosis not present

## 2024-04-04 DIAGNOSIS — M545 Low back pain, unspecified: Secondary | ICD-10-CM | POA: Diagnosis not present

## 2024-04-04 DIAGNOSIS — M533 Sacrococcygeal disorders, not elsewhere classified: Secondary | ICD-10-CM | POA: Diagnosis not present

## 2024-04-25 DIAGNOSIS — E7800 Pure hypercholesterolemia, unspecified: Secondary | ICD-10-CM | POA: Diagnosis not present

## 2024-04-25 DIAGNOSIS — E103299 Type 1 diabetes mellitus with mild nonproliferative diabetic retinopathy without macular edema, unspecified eye: Secondary | ICD-10-CM | POA: Diagnosis not present

## 2024-04-25 DIAGNOSIS — Z4681 Encounter for fitting and adjustment of insulin pump: Secondary | ICD-10-CM | POA: Diagnosis not present

## 2024-04-25 DIAGNOSIS — Z794 Long term (current) use of insulin: Secondary | ICD-10-CM | POA: Diagnosis not present

## 2024-04-25 DIAGNOSIS — Z23 Encounter for immunization: Secondary | ICD-10-CM | POA: Diagnosis not present

## 2024-05-08 DIAGNOSIS — E109 Type 1 diabetes mellitus without complications: Secondary | ICD-10-CM | POA: Diagnosis not present

## 2024-05-25 DIAGNOSIS — E109 Type 1 diabetes mellitus without complications: Secondary | ICD-10-CM | POA: Diagnosis not present

## 2024-05-31 DIAGNOSIS — H524 Presbyopia: Secondary | ICD-10-CM | POA: Diagnosis not present

## 2024-05-31 DIAGNOSIS — E119 Type 2 diabetes mellitus without complications: Secondary | ICD-10-CM | POA: Diagnosis not present

## 2024-05-31 DIAGNOSIS — H2513 Age-related nuclear cataract, bilateral: Secondary | ICD-10-CM | POA: Diagnosis not present

## 2024-06-19 ENCOUNTER — Other Ambulatory Visit: Payer: Self-pay | Admitting: Internal Medicine

## 2024-06-19 DIAGNOSIS — Z1231 Encounter for screening mammogram for malignant neoplasm of breast: Secondary | ICD-10-CM
